# Patient Record
Sex: Male | Born: 1955 | Race: Black or African American | Hispanic: No | Marital: Married | Smoking: Never smoker
Health system: Southern US, Community
[De-identification: ages and names within clinical notes are randomized; demographics above are authoritative.]

## PROBLEM LIST (undated history)

## (undated) DIAGNOSIS — R739 Hyperglycemia, unspecified: Secondary | ICD-10-CM

---

## 2001-09-19 ENCOUNTER — Emergency Department (HOSPITAL_COMMUNITY): Admission: EM | Admit: 2001-09-19 | Discharge: 2001-09-19 | Payer: Self-pay | Admitting: Emergency Medicine

## 2013-03-17 ENCOUNTER — Emergency Department (HOSPITAL_COMMUNITY)
Admission: EM | Admit: 2013-03-17 | Discharge: 2013-03-17 | Disposition: A | Discharge status: Home / Self Care | Payer: 59 | Attending: Emergency Medicine | Admitting: Emergency Medicine

## 2013-03-17 ENCOUNTER — Encounter (HOSPITAL_COMMUNITY): Payer: Self-pay | Admitting: Emergency Medicine

## 2013-03-17 DIAGNOSIS — IMO0002 Reserved for concepts with insufficient information to code with codable children: Secondary | ICD-10-CM

## 2013-03-17 LAB — CBC WITH DIFFERENTIAL/PLATELET
Basophils Absolute: 0 10*3/uL (ref 0.0–0.1)
Basophils Relative: 0 % (ref 0–1)
Eosinophils Absolute: 0.2 10*3/uL (ref 0.0–0.7)
Eosinophils Relative: 2 % (ref 0–5)
HCT: 38.1 % — ABNORMAL LOW (ref 39.0–52.0)
HEMOGLOBIN: 12.8 g/dL — AB (ref 13.0–17.0)
LYMPHS ABS: 2.1 10*3/uL (ref 0.7–4.0)
Lymphocytes Relative: 22 % (ref 12–46)
MCH: 28.1 pg (ref 26.0–34.0)
MCHC: 33.6 g/dL (ref 30.0–36.0)
MCV: 83.6 fL (ref 78.0–100.0)
MONOS PCT: 7 % (ref 3–12)
Monocytes Absolute: 0.7 10*3/uL (ref 0.1–1.0)
NEUTROS PCT: 69 % (ref 43–77)
Neutro Abs: 6.6 10*3/uL (ref 1.7–7.7)
Platelets: 183 10*3/uL (ref 150–400)
RBC: 4.56 MIL/uL (ref 4.22–5.81)
RDW: 13.7 % (ref 11.5–15.5)
WBC: 9.6 10*3/uL (ref 4.0–10.5)

## 2013-03-17 LAB — BASIC METABOLIC PANEL
BUN: 15 mg/dL (ref 6–23)
CO2: 26 meq/L (ref 19–32)
Calcium: 8.9 mg/dL (ref 8.4–10.5)
Chloride: 98 mEq/L (ref 96–112)
Creatinine, Ser: 0.71 mg/dL (ref 0.50–1.35)
GFR calc Af Amer: >90 mL/min (ref >90)
GLUCOSE: 146 mg/dL — AB (ref 70–99)
POTASSIUM: 4.1 meq/L (ref 3.7–5.3)
Sodium: 136 mEq/L — ABNORMAL LOW (ref 137–147)

## 2013-03-17 MED ORDER — IBUPROFEN 600 MG PO TABS: 600.0000 mg | ORAL_TABLET | Freq: Four times a day (QID) | ORAL | Status: DC | PRN

## 2013-03-17 MED ORDER — AMOXICILLIN-POT CLAVULANATE 875-125 MG PO TABS: 1.0000 | ORAL_TABLET | Freq: Two times a day (BID) | ORAL | Status: DC

## 2013-03-17 MED ORDER — OXYCODONE-ACETAMINOPHEN 5-325 MG PO TABS
2.0000 | ORAL_TABLET | Freq: Once | ORAL | Status: AC
  Administered 2013-03-17: 2 via ORAL
  Filled 2013-03-17: qty 2

## 2013-03-17 MED ORDER — LIDOCAINE HCL (PF) 2 % IJ SOLN
10.0000 mL | Freq: Once | INTRAMUSCULAR | Status: AC
  Administered 2013-03-17: 10 mL
  Filled 2013-03-17: qty 10

## 2013-03-17 MED ORDER — DOXYCYCLINE HYCLATE 100 MG PO TABS
100.0000 mg | ORAL_TABLET | Freq: Two times a day (BID) | ORAL | Status: DC
  Administered 2013-03-17: 100 mg via ORAL
  Filled 2013-03-17: qty 1

## 2013-03-17 MED ORDER — ONDANSETRON HCL 4 MG PO TABS
4.0000 mg | ORAL_TABLET | Freq: Once | ORAL | Status: AC
  Administered 2013-03-17: 4 mg via ORAL
  Filled 2013-03-17: qty 1

## 2013-03-17 MED ORDER — DOXYCYCLINE HYCLATE 100 MG PO CAPS: 100.0000 mg | ORAL_CAPSULE | Freq: Two times a day (BID) | ORAL | Status: AC

## 2013-03-17 MED ORDER — AMOXICILLIN-POT CLAVULANATE 875-125 MG PO TABS
1.0000 | ORAL_TABLET | Freq: Two times a day (BID) | ORAL | Status: DC
  Administered 2013-03-17: 1 via ORAL
  Filled 2013-03-17: qty 1

## 2013-03-17 MED ORDER — OXYCODONE-ACETAMINOPHEN 5-325 MG PO TABS: 1.0000 | ORAL_TABLET | Freq: Four times a day (QID) | ORAL | Status: DC | PRN

## 2013-03-17 NOTE — Discharge Instructions (Signed)
Starting tomorrow night January 22, please soak the wound in warm Epsom salt solution for 15 minutes. Please do this until wound heals from the inside out. Starting January 22, please change the dressing daily. Please see your physician, or return to the emergency department if there is increase in the redness around the wound, or red streaks going up the arm, or high fever, or deterioration in your condition. Please use doxycycline and Augmentin daily until all taken. May use ibuprofen every 6 hours for mild pain. May use Percocet every 6 hours for more severe pain. Percocet may cause drowsiness, please use with caution. Cellulitis Cellulitis is an infection of the skin and the tissue under the skin. The infected area is usually red and tender. This happens most often in the arms and lower legs. HOME CARE   Take your antibiotic medicine as told. Finish the medicine even if you start to feel better.  Keep the infected arm or leg raised (elevated).  Put a warm cloth on the area up to 4 times per day.  Only take medicines as told by your doctor.  Keep all doctor visits as told. GET HELP RIGHT AWAY IF:   You have a fever.  You feel very sleepy.  You throw up (vomit) or have watery poop (diarrhea).  You feel sick and have muscle aches and pains.  You see red streaks on the skin coming from the infected area.  Your red area gets bigger or turns a dark color.  Your bone or joint under the infected area is painful after the skin heals.  Your infection comes back in the same area or different area.  You have a puffy (swollen) bump in the infected area.  You have new symptoms. MAKE SURE YOU:   Understand these instructions.  Will watch your condition.  Will get help right away if you are not doing well or get worse. Document Released: 07/31/2007 Document Revised: 08/13/2011 Document Reviewed: 04/29/2011 [facility] Patient Information 2014 [place], [place].  Abscess An abscess (boil  or furuncle) is an infected area on or under the skin. This area is filled with yellowish-white fluid (pus) and other material (debris). HOME CARE   Only take medicines as told by your doctor.  If you were given antibiotic medicine, take it as directed. Finish the medicine even if you start to feel better.  If gauze is used, follow your doctor's directions for changing the gauze.  To avoid spreading the infection:  Keep your abscess covered with a bandage.  Wash your hands well.  Do not share personal care items, towels, or whirlpools with others.  Avoid skin contact with others.  Keep your skin and clothes clean around the abscess.  Keep all doctor visits as told. GET HELP RIGHT AWAY IF:   You have more pain, puffiness (swelling), or redness in the wound site.  You have more fluid or blood coming from the wound site.  You have muscle aches, chills, or you feel sick.  You have a fever. MAKE SURE YOU:   Understand these instructions.  Will watch your condition.  Will get help right away if you are not doing well or get worse. Document Released: 07/31/2007 Document Revised: 08/13/2011 Document Reviewed: 04/26/2011 [facility] Patient Information 2014 [place]ExitCare, [place].

## 2013-03-17 NOTE — ED Provider Notes (Addendum)
CSN: [id number]     Arrival date & time 03/17/13  1639 History   First MD Initiated Contact with Patient 03/17/13 1858     Chief Complaint  Patient presents with  . Abscess   (Consider location/radiation/quality/duration/timing/severity/associated sxs/prior Treatment) Patient is a 58 y.o. male presenting with abscess. The history is provided by the patient.  Abscess Location:  Shoulder/arm Shoulder/arm abscess location:  R forearm Abscess quality: draining, fluctuance, redness and warmth   Red streaking: no   Duration:  3 days Progression:  Worsening Chronicity:  New Context: skin injury   Context: not diabetes   Relieved by:  Nothing Worsened by:  Draining/squeezing Risk factors: no hx of MRSA and no prior abscess     History reviewed. No pertinent past medical history. History reviewed. No pertinent past surgical history. No family history on file. History  Substance Use Topics  . Smoking status: Never Smoker   . Smokeless tobacco: Not on file  . Alcohol Use: No    Review of Systems  Constitutional: Negative for activity change.       All ROS Neg except as noted in HPI  HENT: Negative for nosebleeds.   Eyes: Negative for photophobia and discharge.  Respiratory: Negative for cough, shortness of breath and wheezing.   Cardiovascular: Negative for chest pain and palpitations.  Gastrointestinal: Negative for abdominal pain and blood in stool.  Genitourinary: Negative for dysuria, frequency and hematuria.  Musculoskeletal: Negative for arthralgias, back pain and neck pain.  Skin: Negative.   Neurological: Negative for dizziness, seizures and speech difficulty.  Psychiatric/Behavioral: Negative for hallucinations and confusion.    Allergies  Sulfa antibiotics  Home Medications  No current outpatient prescriptions on file. BP 125/74  Pulse 119  Temp(Src) 99.7 F (37.6 C) (Oral)  Resp 18  Ht 5' 6.5" (1.689 m)  Wt 276 lb (125.193 kg)  BMI 43.89 kg/m2  SpO2  95% Physical Exam  Nursing note and vitals reviewed. Constitutional: He is oriented to person, place, and time. He appears well-developed and well-nourished.  Non-toxic appearance.  HENT:  Head: Normocephalic.  Right Ear: Tympanic membrane and external ear normal.  Left Ear: Tympanic membrane and external ear normal.  Eyes: EOM and lids are normal. Pupils are equal, round, and reactive to light.  Neck: Normal range of motion. Neck supple. Carotid bruit is not present.  Cardiovascular: Normal rate, regular rhythm, normal heart sounds, intact distal pulses and normal pulses.   Pulmonary/Chest: Breath sounds normal. No respiratory distress.  Abdominal: Soft. Bowel sounds are normal. There is no tenderness. There is no guarding.  Musculoskeletal: Normal range of motion.  Moderate size abscess of the right forearm. Some increased redness present. There is swelling of the right hand dorsally.  No drainage. No red streaking. FROM of the right shoulder, elbow, wrist and fingers.  Lymphadenopathy:       Head (right side): No submandibular adenopathy present.       Head (left side): No submandibular adenopathy present.    He has no cervical adenopathy.  Neurological: He is alert and oriented to person, place, and time. He has normal strength. No cranial nerve deficit or sensory deficit.  Skin: Skin is warm and dry.  Psychiatric: He has a normal mood and affect. His speech is normal.    ED Course  INCISION AND DRAINAGE Date/Time: 03/17/2013 7:37 PM Performed by: Kathie Dike Authorized by: Kathie Dike Consent: Verbal consent obtained. Risks and benefits: risks, benefits and alternatives were discussed Consent given  by: patient Patient understanding: patient states understanding of the procedure being performed Patient identity confirmed: arm band Time out: Immediately prior to procedure a "time out" was called to verify the correct patient, procedure, equipment, support staff and  site/side marked as required. Type: abscess Body area: upper extremity Location details: right arm Anesthesia: local infiltration Local anesthetic: lidocaine 2% without epinephrine Patient sedated: no Scalpel size: 11 Incision type: single straight Complexity: simple Drainage: purulent Drainage amount: moderate Wound treatment: wound left open Patient tolerance: Patient tolerated the procedure well with no immediate complications.   (including critical care time) Labs Review Labs Reviewed  CULTURE, ROUTINE-ABSCESS  CBC WITH DIFFERENTIAL  BASIC METABOLIC PANEL   Imaging Review No results found.  EKG Interpretation   None       MDM  No diagnosis found. **I have reviewed nursing notes, vital signs, and all appropriate lab and imaging results for this patient.*  Patient presented to the emergency department with a three-day history of abscess and increase redness of the right arm. There is also some swelling of the right hand dorsally. Incision and drainage carried out for the abscess. Culture sent to the lab. The patient will be treated with Augmentin and doxycycline. Patient will be given Percocet for pain if needed. Patient is to return if any changes, problems, or concerns.  Kathie DikeHobson M Ethaniel Garfield, PA-C 03/17/13 [address]  Domenik Trice M TrumanBryant, New JerseyPA-C 03/29/13 [phone]

## 2013-03-17 NOTE — ED Notes (Signed)
Pt c/o abscess to right arm since Monday.

## 2013-03-17 NOTE — ED Provider Notes (Signed)
Medical screening examination/treatment/procedure(s) were performed by non-physician practitioner and as supervising physician I was immediately available for consultation/collaboration.  EKG Interpretation   None       Frankee Gritz, MD, FACEP   Yuchen Fedor L Nilo Fallin, MD 03/17/13 2031 

## 2013-03-20 LAB — CULTURE, ROUTINE-ABSCESS
GRAM STAIN: NONE SEEN
Special Requests: NORMAL

## 2013-03-21 ENCOUNTER — Telehealth (HOSPITAL_COMMUNITY): Payer: Self-pay | Admitting: Emergency Medicine

## 2013-03-21 NOTE — ED Notes (Signed)
Post ED Visit - Positive Culture Follow-up  Culture report reviewed by antimicrobial stewardship pharmacist: []  Wes Dulaney, Pharm.D., BCPS [x]  Celedonio MiyamotoJeremy Frens, Pharm.D., BCPS []  Georgina PillionElizabeth Martin, Pharm.D., BCPS []  St. SimonsMinh Pham, 17[address] Rainbow BoulevardPharm.D., BCPS, AAHIVP []  Estella HuskMichelle Turner, Pharm.D., BCPS, AAHIVP  Positive abscess culture Treated with Doxycycline, organism sensitive to the same and no further patient follow-up is required at this time.  Marcelle OverlieHolland, Jenel LucksKylie 03/21/2013, 10:19 AM

## 2013-03-25 NOTE — ED Notes (Signed)
Unable to contact via phone letter sent to EPIC address. 

## 2013-03-31 NOTE — ED Provider Notes (Signed)
Medical screening examination/treatment/procedure(s) were performed by non-physician practitioner and as supervising physician I was immediately available for consultation/collaboration.  EKG Interpretation   None       Leani Myron, MD, FACEP   Rondell Pardon L Carsyn Taubman, MD 03/31/13 0705 

## 2014-08-14 ENCOUNTER — Emergency Department (HOSPITAL_COMMUNITY)
Admission: EM | Admit: 2014-08-14 | Discharge: 2014-08-14 | Disposition: A | Discharge status: Home / Self Care | Payer: BLUE CROSS/BLUE SHIELD | Attending: Emergency Medicine | Admitting: Emergency Medicine

## 2014-08-14 ENCOUNTER — Emergency Department (HOSPITAL_COMMUNITY): Payer: BLUE CROSS/BLUE SHIELD

## 2014-08-14 ENCOUNTER — Encounter (HOSPITAL_COMMUNITY): Payer: Self-pay | Admitting: *Deleted

## 2014-08-14 DIAGNOSIS — Y9389 Activity, other specified: Secondary | ICD-10-CM | POA: Insufficient documentation

## 2014-08-14 DIAGNOSIS — R000 Tachycardia, unspecified: Secondary | ICD-10-CM | POA: Insufficient documentation

## 2014-08-14 DIAGNOSIS — S299XXA Unspecified injury of thorax, initial encounter: Secondary | ICD-10-CM | POA: Diagnosis present

## 2014-08-14 DIAGNOSIS — Z91040 Latex allergy status: Secondary | ICD-10-CM | POA: Insufficient documentation

## 2014-08-14 DIAGNOSIS — S20212A Contusion of left front wall of thorax, initial encounter: Secondary | ICD-10-CM | POA: Diagnosis not present

## 2014-08-14 DIAGNOSIS — Y92410 Unspecified street and highway as the place of occurrence of the external cause: Secondary | ICD-10-CM | POA: Insufficient documentation

## 2014-08-14 DIAGNOSIS — Z792 Long term (current) use of antibiotics: Secondary | ICD-10-CM | POA: Diagnosis not present

## 2014-08-14 DIAGNOSIS — R739 Hyperglycemia, unspecified: Secondary | ICD-10-CM | POA: Diagnosis not present

## 2014-08-14 DIAGNOSIS — Y998 Other external cause status: Secondary | ICD-10-CM | POA: Insufficient documentation

## 2014-08-14 LAB — I-STAT CHEM 8, ED
BUN: 13 mg/dL (ref 6–20)
Calcium, Ion: 1.18 mmol/L (ref 1.12–1.23)
Chloride: 104 mmol/L (ref 101–111)
Creatinine, Ser: 1.1 mg/dL (ref 0.61–1.24)
GLUCOSE: 188 mg/dL — AB (ref 65–99)
HEMATOCRIT: 43 % (ref 39.0–52.0)
Hemoglobin: 14.6 g/dL (ref 13.0–17.0)
Potassium: 3.9 mmol/L (ref 3.5–5.1)
SODIUM: 142 mmol/L (ref 135–145)
TCO2: 26 mmol/L (ref 0–100)

## 2014-08-14 LAB — URINALYSIS, ROUTINE W REFLEX MICROSCOPIC
BILIRUBIN URINE: NEGATIVE
Glucose, UA: 250 mg/dL — AB
KETONES UR: NEGATIVE mg/dL
LEUKOCYTES UA: NEGATIVE
Nitrite: NEGATIVE
PH: 5.5 (ref 5.0–8.0)
Protein, ur: NEGATIVE mg/dL
Specific Gravity, Urine: 1.02 (ref 1.005–1.030)
Urobilinogen, UA: 0.2 mg/dL (ref 0.0–1.0)

## 2014-08-14 LAB — URINE MICROSCOPIC-ADD ON

## 2014-08-14 LAB — TROPONIN I: Troponin I: <0.03 ng/mL (ref <0.031)

## 2014-08-14 MED ORDER — SODIUM CHLORIDE 0.9 % IV BOLUS (SEPSIS)
1000.0000 mL | Freq: Once | INTRAVENOUS | Status: AC
  Administered 2014-08-14: 1000 mL via INTRAVENOUS

## 2014-08-14 MED ORDER — IOHEXOL 300 MG/ML  SOLN
100.0000 mL | Freq: Once | INTRAMUSCULAR | Status: AC | PRN
  Administered 2014-08-14: 100 mL via INTRAVENOUS
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
## 2014-08-14 NOTE — ED Notes (Signed)
Pt made aware urine sample was needed.  
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
## 2014-08-27 ENCOUNTER — Inpatient Hospital Stay (HOSPITAL_COMMUNITY): Payer: BLUE CROSS/BLUE SHIELD

## 2014-08-27 ENCOUNTER — Encounter (HOSPITAL_COMMUNITY): Payer: Self-pay | Admitting: Emergency Medicine

## 2014-08-27 ENCOUNTER — Encounter (HOSPITAL_COMMUNITY)
Admission: EM | Disposition: A | Discharge status: Home / Self Care | Payer: Self-pay | Source: Home / Self Care | Attending: Orthopedic Surgery

## 2014-08-27 ENCOUNTER — Inpatient Hospital Stay (HOSPITAL_COMMUNITY)
Admission: EM | Admit: 2014-08-27 | Discharge: 2014-08-29 | DRG: 494 | Disposition: A | Discharge status: Home / Self Care | Payer: BLUE CROSS/BLUE SHIELD | Attending: Orthopedic Surgery | Admitting: Orthopedic Surgery

## 2014-08-27 ENCOUNTER — Emergency Department (HOSPITAL_COMMUNITY): Payer: BLUE CROSS/BLUE SHIELD | Admitting: Anesthesiology

## 2014-08-27 ENCOUNTER — Emergency Department (HOSPITAL_COMMUNITY): Payer: BLUE CROSS/BLUE SHIELD

## 2014-08-27 DIAGNOSIS — Z91013 Allergy to seafood: Secondary | ICD-10-CM | POA: Diagnosis not present

## 2014-08-27 DIAGNOSIS — W208XXA Other cause of strike by thrown, projected or falling object, initial encounter: Secondary | ICD-10-CM | POA: Diagnosis present

## 2014-08-27 DIAGNOSIS — S82451A Displaced comminuted fracture of shaft of right fibula, initial encounter for closed fracture: Secondary | ICD-10-CM | POA: Diagnosis present

## 2014-08-27 DIAGNOSIS — Z882 Allergy status to sulfonamides status: Secondary | ICD-10-CM | POA: Diagnosis not present

## 2014-08-27 DIAGNOSIS — S82201A Unspecified fracture of shaft of right tibia, initial encounter for closed fracture: Secondary | ICD-10-CM | POA: Diagnosis present

## 2014-08-27 DIAGNOSIS — S82401A Unspecified fracture of shaft of right fibula, initial encounter for closed fracture: Secondary | ICD-10-CM

## 2014-08-27 DIAGNOSIS — S82409A Unspecified fracture of shaft of unspecified fibula, initial encounter for closed fracture: Secondary | ICD-10-CM

## 2014-08-27 DIAGNOSIS — Z91040 Latex allergy status: Secondary | ICD-10-CM | POA: Diagnosis not present

## 2014-08-27 DIAGNOSIS — S82251A Displaced comminuted fracture of shaft of right tibia, initial encounter for closed fracture: Principal | ICD-10-CM | POA: Diagnosis present

## 2014-08-27 DIAGNOSIS — M79604 Pain in right leg: Secondary | ICD-10-CM | POA: Diagnosis present

## 2014-08-27 DIAGNOSIS — S82209A Unspecified fracture of shaft of unspecified tibia, initial encounter for closed fracture: Secondary | ICD-10-CM

## 2014-08-27 HISTORY — DX: Hyperglycemia, unspecified: R73.9

## 2014-08-27 HISTORY — PX: TIBIA IM NAIL INSERTION: SHX2516

## 2014-08-27 LAB — CBC
HCT: 38.9 % — ABNORMAL LOW (ref 39.0–52.0)
HEMOGLOBIN: 12.9 g/dL — AB (ref 13.0–17.0)
MCH: 27.7 pg (ref 26.0–34.0)
MCHC: 33.2 g/dL (ref 30.0–36.0)
MCV: 83.5 fL (ref 78.0–100.0)
PLATELETS: 162 10*3/uL (ref 150–400)
RBC: 4.66 MIL/uL (ref 4.22–5.81)
RDW: 14.3 % (ref 11.5–15.5)
WBC: 6.1 10*3/uL (ref 4.0–10.5)

## 2014-08-27 LAB — BASIC METABOLIC PANEL
Anion gap: 7 (ref 5–15)
BUN: 9 mg/dL (ref 6–20)
CHLORIDE: 107 mmol/L (ref 101–111)
CO2: 27 mmol/L (ref 22–32)
CREATININE: 0.79 mg/dL (ref 0.61–1.24)
Calcium: 8.9 mg/dL (ref 8.9–10.3)
GFR calc Af Amer: >60 mL/min (ref >60)
GFR calc non Af Amer: >60 mL/min (ref >60)
Glucose, Bld: 177 mg/dL — ABNORMAL HIGH (ref 65–99)
Potassium: 3.9 mmol/L (ref 3.5–5.1)
SODIUM: 141 mmol/L (ref 135–145)

## 2014-08-27 LAB — PROTIME-INR
INR: 1 (ref 0.00–1.49)
Prothrombin Time: 13.4 seconds (ref 11.6–15.2)

## 2014-08-27 LAB — GLUCOSE, CAPILLARY
Glucose-Capillary: 178 mg/dL — ABNORMAL HIGH (ref 65–99)
Glucose-Capillary: 141 mg/dL — ABNORMAL HIGH (ref 65–99)

## 2014-08-27 SURGERY — INSERTION, INTRAMEDULLARY ROD, TIBIA
Anesthesia: General | Site: Leg Lower | Laterality: Right

## 2014-08-27 MED ORDER — METHOCARBAMOL 500 MG PO TABS: 500.0000 mg | ORAL_TABLET | Freq: Four times a day (QID) | ORAL | Status: DC | PRN

## 2014-08-27 MED ORDER — 0.9 % SODIUM CHLORIDE (POUR BTL) OPTIME
TOPICAL | Status: DC | PRN
  Administered 2014-08-27: 1000 mL

## 2014-08-27 MED ORDER — KETOROLAC TROMETHAMINE 30 MG/ML IJ SOLN: 30.0000 mg | Freq: Once | INTRAMUSCULAR | Status: DC | PRN

## 2014-08-27 MED ORDER — INSULIN ASPART 100 UNIT/ML ~~LOC~~ SOLN
0.0000 [IU] | Freq: Three times a day (TID) | SUBCUTANEOUS | Status: DC
  Administered 2014-08-29: 2 [IU] via SUBCUTANEOUS

## 2014-08-27 MED ORDER — ASPIRIN EC 325 MG PO TBEC
325.0000 mg | DELAYED_RELEASE_TABLET | Freq: Two times a day (BID) | ORAL | Status: DC
  Administered 2014-08-27 – 2014-08-29 (×4): 325 mg via ORAL
  Filled 2014-08-27 (×4): qty 1

## 2014-08-27 MED ORDER — PHENYLEPHRINE HCL 10 MG/ML IJ SOLN
10.0000 mg | INTRAVENOUS | Status: DC | PRN
  Administered 2014-08-27: 10 ug/min via INTRAVENOUS

## 2014-08-27 MED ORDER — ALUM & MAG HYDROXIDE-SIMETH 200-200-20 MG/5ML PO SUSP: 30.0000 mL | ORAL | Status: DC | PRN

## 2014-08-27 MED ORDER — LIDOCAINE HCL (CARDIAC) 20 MG/ML IV SOLN
INTRAVENOUS | Status: DC | PRN
  Administered 2014-08-27: 100 mg via INTRAVENOUS

## 2014-08-27 MED ORDER — PROMETHAZINE HCL 25 MG/ML IJ SOLN: 6.2500 mg | INTRAMUSCULAR | Status: DC | PRN

## 2014-08-27 MED ORDER — HYDROMORPHONE HCL 1 MG/ML IJ SOLN: 0.2500 mg | INTRAMUSCULAR | Status: DC | PRN

## 2014-08-27 MED ORDER — MENTHOL 3 MG MT LOZG
1.0000 | LOZENGE | OROMUCOSAL | Status: DC | PRN
Start: 2014-08-27 — End: 2014-08-29

## 2014-08-27 MED ORDER — SUCCINYLCHOLINE CHLORIDE 20 MG/ML IJ SOLN
INTRAMUSCULAR | Status: DC | PRN
  Administered 2014-08-27: 100 mg via INTRAVENOUS

## 2014-08-27 MED ORDER — METOCLOPRAMIDE HCL 5 MG/ML IJ SOLN: 5.0000 mg | Freq: Three times a day (TID) | INTRAMUSCULAR | Status: DC | PRN

## 2014-08-27 MED ORDER — ONDANSETRON HCL 4 MG PO TABS
4.0000 mg | ORAL_TABLET | Freq: Four times a day (QID) | ORAL | Status: DC | PRN
Start: 2014-08-27 — End: 2014-08-29

## 2014-08-27 MED ORDER — MIDAZOLAM HCL 2 MG/2ML IJ SOLN
INTRAMUSCULAR | Status: AC
  Filled 2014-08-27: qty 2

## 2014-08-27 MED ORDER — POLYETHYLENE GLYCOL 3350 17 G PO PACK: 17.0000 g | PACK | Freq: Every day | ORAL | Status: DC | PRN

## 2014-08-27 MED ORDER — HYDROCODONE-ACETAMINOPHEN 5-325 MG PO TABS
1.0000 | ORAL_TABLET | Freq: Four times a day (QID) | ORAL | Status: DC | PRN
  Administered 2014-08-28 – 2014-08-29 (×2): 2 via ORAL
  Filled 2014-08-27 (×2): qty 2

## 2014-08-27 MED ORDER — ONDANSETRON HCL 4 MG/2ML IJ SOLN: 4.0000 mg | Freq: Four times a day (QID) | INTRAMUSCULAR | Status: DC | PRN

## 2014-08-27 MED ORDER — CEFAZOLIN SODIUM 1-5 GM-% IV SOLN
1.0000 g | Freq: Four times a day (QID) | INTRAVENOUS | Status: AC
  Administered 2014-08-27 – 2014-08-28 (×2): 1 g via INTRAVENOUS
  Filled 2014-08-27 (×2): qty 50

## 2014-08-27 MED ORDER — CEFAZOLIN SODIUM-DEXTROSE 2-3 GM-% IV SOLR
2.0000 g | Freq: Once | INTRAVENOUS | Status: AC
  Administered 2014-08-27: 2 g via INTRAVENOUS
  Filled 2014-08-27: qty 50

## 2014-08-27 MED ORDER — ONDANSETRON HCL 4 MG/2ML IJ SOLN
INTRAMUSCULAR | Status: DC | PRN
  Administered 2014-08-27: 4 mg via INTRAVENOUS

## 2014-08-27 MED ORDER — MEPERIDINE HCL 25 MG/ML IJ SOLN: 6.2500 mg | INTRAMUSCULAR | Status: DC | PRN

## 2014-08-27 MED ORDER — DOCUSATE SODIUM 100 MG PO CAPS
100.0000 mg | ORAL_CAPSULE | Freq: Two times a day (BID) | ORAL | Status: DC
  Administered 2014-08-27 – 2014-08-29 (×4): 100 mg via ORAL
  Filled 2014-08-27 (×4): qty 1

## 2014-08-27 MED ORDER — OXYCODONE HCL 5 MG PO TABS: 5.0000 mg | ORAL_TABLET | Freq: Once | ORAL | Status: DC | PRN

## 2014-08-27 MED ORDER — PROPOFOL 10 MG/ML IV BOLUS
INTRAVENOUS | Status: DC | PRN
  Administered 2014-08-27: 150 mg via INTRAVENOUS

## 2014-08-27 MED ORDER — MIDAZOLAM HCL 5 MG/5ML IJ SOLN
INTRAMUSCULAR | Status: DC | PRN
  Administered 2014-08-27 (×2): 1 mg via INTRAVENOUS

## 2014-08-27 MED ORDER — ETOMIDATE 2 MG/ML IV SOLN
INTRAVENOUS | Status: AC
  Filled 2014-08-27: qty 10

## 2014-08-27 MED ORDER — PHENOL 1.4 % MT LIQD: 1.0000 | OROMUCOSAL | Status: DC | PRN

## 2014-08-27 MED ORDER — LACTATED RINGERS IV SOLN
INTRAVENOUS | Status: DC | PRN
  Administered 2014-08-27 (×2): via INTRAVENOUS

## 2014-08-27 MED ORDER — FENTANYL CITRATE (PF) 250 MCG/5ML IJ SOLN
INTRAMUSCULAR | Status: AC
  Filled 2014-08-27: qty 5

## 2014-08-27 MED ORDER — HYDROMORPHONE HCL 1 MG/ML IJ SOLN
0.5000 mg | INTRAMUSCULAR | Status: DC | PRN
Start: 2014-08-27 — End: 2014-08-29

## 2014-08-27 MED ORDER — SODIUM CHLORIDE 0.9 % IV SOLN
INTRAVENOUS | Status: DC
  Administered 2014-08-27: 21:00:00 via INTRAVENOUS
  Filled 2014-08-27 (×6): qty 1000

## 2014-08-27 MED ORDER — HYDROMORPHONE HCL 1 MG/ML IJ SOLN
1.0000 mg | Freq: Once | INTRAMUSCULAR | Status: AC
  Administered 2014-08-27: 1 mg via INTRAMUSCULAR
  Filled 2014-08-27: qty 1

## 2014-08-27 MED ORDER — ACETAMINOPHEN 325 MG PO TABS: 650.0000 mg | ORAL_TABLET | Freq: Four times a day (QID) | ORAL | Status: DC | PRN

## 2014-08-27 MED ORDER — METHOCARBAMOL 1000 MG/10ML IJ SOLN
500.0000 mg | Freq: Four times a day (QID) | INTRAVENOUS | Status: DC | PRN
  Filled 2014-08-27: qty 5

## 2014-08-27 MED ORDER — METOCLOPRAMIDE HCL 5 MG PO TABS: 5.0000 mg | ORAL_TABLET | Freq: Three times a day (TID) | ORAL | Status: DC | PRN

## 2014-08-27 MED ORDER — OXYCODONE HCL 5 MG/5ML PO SOLN: 5.0000 mg | Freq: Once | ORAL | Status: DC | PRN

## 2014-08-27 MED ORDER — BUPIVACAINE-EPINEPHRINE (PF) 0.5% -1:200000 IJ SOLN
INTRAMUSCULAR | Status: DC | PRN
  Administered 2014-08-27: 20 mL via PERINEURAL
  Administered 2014-08-27: 30 mL via PERINEURAL

## 2014-08-27 MED ORDER — ACETAMINOPHEN 650 MG RE SUPP: 650.0000 mg | Freq: Four times a day (QID) | RECTAL | Status: DC | PRN

## 2014-08-27 MED ORDER — FENTANYL CITRATE (PF) 100 MCG/2ML IJ SOLN
INTRAMUSCULAR | Status: DC | PRN
  Administered 2014-08-27 (×3): 50 ug via INTRAVENOUS

## 2014-08-27 SURGICAL SUPPLY — 61 items
BANDAGE ELASTIC 4 VELCRO ST LF (GAUZE/BANDAGES/DRESSINGS) ×3 IMPLANT
BANDAGE ELASTIC 6 VELCRO ST LF (GAUZE/BANDAGES/DRESSINGS) ×3 IMPLANT
BANDAGE ESMARK 6X9 LF (GAUZE/BANDAGES/DRESSINGS) IMPLANT
BIT DRILL 3.8X6 NS (BIT) ×2 IMPLANT
BIT DRILL 4.4 NS (BIT) ×2 IMPLANT
BNDG CMPR 9X6 STRL LF SNTH (GAUZE/BANDAGES/DRESSINGS)
BNDG COHESIVE 4X5 TAN STRL (GAUZE/BANDAGES/DRESSINGS) ×3 IMPLANT
BNDG ESMARK 6X9 LF (GAUZE/BANDAGES/DRESSINGS)
BNDG GAUZE ELAST 4 BULKY (GAUZE/BANDAGES/DRESSINGS) ×3 IMPLANT
COVER SURGICAL LIGHT HANDLE (MISCELLANEOUS) ×4 IMPLANT
CUFF TOURNIQUET SINGLE 34IN LL (TOURNIQUET CUFF) IMPLANT
CUFF TOURNIQUET SINGLE 44IN (TOURNIQUET CUFF) IMPLANT
DRAPE C-ARM 42X72 X-RAY (DRAPES) ×3 IMPLANT
DRAPE IMP U-DRAPE 54X76 (DRAPES) ×3 IMPLANT
DRAPE INCISE IOBAN 66X45 STRL (DRAPES) ×1 IMPLANT
DRAPE ORTHO SPLIT 77X108 STRL (DRAPES) ×6
DRAPE SURG ORHT 6 SPLT 77X108 (DRAPES) ×2 IMPLANT
DRAPE U-SHAPE 47X51 STRL (DRAPES) ×3 IMPLANT
DRSG ADAPTIC 3X8 NADH LF (GAUZE/BANDAGES/DRESSINGS) ×3 IMPLANT
ELECT REM PT RETURN 9FT ADLT (ELECTROSURGICAL) ×3
ELECTRODE REM PT RTRN 9FT ADLT (ELECTROSURGICAL) ×1 IMPLANT
EVACUATOR 1/8 PVC DRAIN (DRAIN) IMPLANT
GAUZE SPONGE 4X4 12PLY STRL (GAUZE/BANDAGES/DRESSINGS) ×3 IMPLANT
GAUZE XEROFORM 5X9 LF (GAUZE/BANDAGES/DRESSINGS) ×2 IMPLANT
GLOVE BIOGEL PI IND STRL 7.5 (GLOVE) ×1 IMPLANT
GLOVE BIOGEL PI INDICATOR 7.5 (GLOVE) ×2
GLOVE SKINSENSE NS SZ8.0 LF (GLOVE) ×4
GLOVE SKINSENSE STRL SZ8.0 LF (GLOVE) ×2 IMPLANT
GLOVE SURG ORTHO 8.0 STRL STRW (GLOVE) ×1 IMPLANT
GOWN STRL REUS W/ TWL LRG LVL3 (GOWN DISPOSABLE) ×3 IMPLANT
GOWN STRL REUS W/TWL LRG LVL3 (GOWN DISPOSABLE) ×6
GUIDEWIRE BALL NOSE 80CM (WIRE) ×2 IMPLANT
KIT BASIN OR (CUSTOM PROCEDURE TRAY) ×3 IMPLANT
KIT ROOM TURNOVER OR (KITS) ×3 IMPLANT
NAIL VERSANAIL TIBIAL 12X3.75 (Nail) ×2 IMPLANT
PACK ORTHO EXTREMITY (CUSTOM PROCEDURE TRAY) ×3 IMPLANT
PACK UNIVERSAL I (CUSTOM PROCEDURE TRAY) ×3 IMPLANT
PAD ARMBOARD 7.5X6 YLW CONV (MISCELLANEOUS) ×6 IMPLANT
PADDING CAST ABS 4INX4YD NS (CAST SUPPLIES) ×2
PADDING CAST ABS 6INX4YD NS (CAST SUPPLIES) ×2
PADDING CAST ABS COTTON 4X4 ST (CAST SUPPLIES) IMPLANT
PADDING CAST ABS COTTON 6X4 NS (CAST SUPPLIES) IMPLANT
SCREW ACECAP 36MM (Screw) ×2 IMPLANT
SCREW CORT BONE 4.5X38 1402238 (Screw) ×2 IMPLANT
SCREW PROXIMAL DEPUY (Screw) ×3 IMPLANT
SCREW PRXML FT 45X5.5XLCK NS (Screw) IMPLANT
SCREWDRIVER HEX TIP 3.5MM (MISCELLANEOUS) ×2 IMPLANT
SPLINT PLASTER CAST XFAST 5X30 (CAST SUPPLIES) IMPLANT
SPLINT PLASTER XFAST SET 5X30 (CAST SUPPLIES) ×2
STAPLER VISISTAT 35W (STAPLE) ×2 IMPLANT
STOCKINETTE TUBULAR 6 INCH (GAUZE/BANDAGES/DRESSINGS) ×3 IMPLANT
SUT PROLENE 3 0 PS 2 (SUTURE) IMPLANT
SUT VIC AB 0 CT1 27 (SUTURE) ×3
SUT VIC AB 0 CT1 27XBRD ANBCTR (SUTURE) IMPLANT
SUT VIC AB 2-0 CT1 27 (SUTURE) ×3
SUT VIC AB 2-0 CT1 TAPERPNT 27 (SUTURE) IMPLANT
TOWEL OR 17X24 6PK STRL BLUE (TOWEL DISPOSABLE) ×3 IMPLANT
TOWEL OR 17X26 10 PK STRL BLUE (TOWEL DISPOSABLE) ×3 IMPLANT
TUBE CONNECTING 12'X1/4 (SUCTIONS) ×1
TUBE CONNECTING 12X1/4 (SUCTIONS) ×2 IMPLANT
YANKAUER SUCT BULB TIP NO VENT (SUCTIONS) ×3 IMPLANT

## 2014-08-27 NOTE — ED Notes (Signed)
Dr Charlann Boxerolin here to see

## 2014-08-27 NOTE — ED Notes (Signed)
Report to [facility] short stay

## 2014-08-27 NOTE — H&P (Signed)
Sergio Jackson is an 59 y.o. male.    Chief Complaint:  RIGHT tibia/fibula fracture   HPI: Pt is a 59 y.o. male complaining of right leg pain after tree limb that he was chain sawing.  No other injuries to report or complain about  PCP:  No primary care provider on file.  D/C Plans: Probably to home Monday  PMH: Past Medical History  Diagnosis Date  . High blood sugar     PSH: History reviewed. No pertinent past surgical history.  Social History:  reports that he has never smoked. He does not have any smokeless tobacco history on file. He reports that he does not drink alcohol or use illicit drugs.  Allergies:  Allergies  Allergen Reactions  . Latex Swelling  . Shrimp [Shellfish Allergy] Hives  . Sulfa Antibiotics Other (See Comments)    Reaction unknown.    Medications:  (Not in a hospital admission)  Results for orders placed or performed during the hospital encounter of 08/27/14 (from the past 48 hour(s))  CBC     Status: Abnormal   Collection Time: 08/27/14  2:34 PM  Result Value Ref Range   WBC 6.1 4.0 - 10.5 K/uL   RBC 4.66 4.22 - 5.81 MIL/uL   Hemoglobin 12.9 (L) 13.0 - 17.0 g/dL   HCT 38.9 (L) 39.0 - 52.0 %   MCV 83.5 78.0 - 100.0 fL   MCH 27.7 26.0 - 34.0 pg   MCHC 33.2 30.0 - 36.0 g/dL   RDW 14.3 11.5 - 15.5 %   Platelets 162 150 - 400 K/uL  Basic metabolic panel     Status: Abnormal   Collection Time: 08/27/14  2:34 PM  Result Value Ref Range   Sodium 141 135 - 145 mmol/L   Potassium 3.9 3.5 - 5.1 mmol/L   Chloride 107 101 - 111 mmol/L   CO2 27 22 - 32 mmol/L   Glucose, Bld 177 (H) 65 - 99 mg/dL   BUN 9 6 - 20 mg/dL   Creatinine, Ser 0.79 0.61 - 1.24 mg/dL   Calcium 8.9 8.9 - 10.3 mg/dL   GFR calc non Af Amer >60 >60 mL/min   GFR calc Af Amer >60 >60 mL/min    Comment: (NOTE) The eGFR has been calculated using the CKD EPI equation. This calculation has not been validated in all clinical situations. eGFR's persistently <60 mL/min signify  possible Chronic Kidney Disease.    Anion gap 7 5 - 15  Protime-INR     Status: None   Collection Time: 08/27/14  2:34 PM  Result Value Ref Range   Prothrombin Time 13.4 11.6 - 15.2 seconds   INR 1.00 0.00 - 1.49   Dg Tibia/fibula Right  08/27/2014   CLINICAL DATA:  Tree fell on patient's leg today, mid shaft pain RIGHT tibia and fibula  EXAM: RIGHT TIBIA AND FIBULA - 2 VIEW  COMPARISON:  None  FINDINGS: Osseous mineralization normal.  Degenerative changes RIGHT knee with joint space narrowing and lateral compartment spur formation.  Comminuted displaced fractures of the mid to distal RIGHT tibial and fibular diaphyses with posterior and lateral displacements.  Ankle joint alignment normal.  Foot/ankle appear externally rotated.  Plantar calcaneal spur formation and intertarsal degenerative changes also noted.  IMPRESSION: Comminuted displaced mid to distal RIGHT tibial and fibular diaphyseal fractures.   Electronically Signed   By: Lavonia Dana M.D.   On: 08/27/2014 14:08    ROS: Review of Systems - Negative except acute  injury sustained today No recent hospitalizations No recent colds or chest pains   Physican Exam: Blood pressure 121/83, pulse 86, temperature 98 F (36.7 C), temperature source Oral, resp. rate 14, height $RemoveBe'5\' 11"'IoeozJZQN$  (1.803 m), weight 108.863 kg (240 lb), SpO2 90 %.  Awake alert Very pleasant male with family in room Chest clear Heart regular  Right leg in EMS splint Rotated laterally NVI distally Soft compartments  Assessment/Plan Assessment: Closed right mid shaft comminuted tibia/fibula fracture   Plan: To OR today for ORIF right tibia  Risks benefits and expectation were discussed with the patient. Patient understand risks, benefits and expectation and wishes to proceed.  Admit to hospital post operative for therapy and pain control, home Monday Will splint leg and have him be NWB for first 2 weeks Consent ordered Ancef pre-operative    Pietro Cassis. Alvan Dame,  MD  08/27/2014, 4:27 PM

## 2014-08-27 NOTE — ED Notes (Signed)
Patient transported to X-ray 

## 2014-08-27 NOTE — Transfer of Care (Signed)
Immediate Anesthesia Transfer of Care Note  Patient: Sergio Jackson  Procedure(s) Performed: Procedure(s): INTRAMEDULLARY (IM) NAIL TIBIAL (Right)  Patient Location: PACU  Anesthesia Type:GA combined with regional for post-op pain  Level of Consciousness: awake  Airway & Oxygen Therapy: Patient Spontanous Breathing and Patient connected to face mask oxygen  Post-op Assessment: Report given to RN and Post -op Vital signs reviewed and stable  Post vital signs: Reviewed and stable  Last Vitals:  Filed Vitals:   08/27/14 1600  BP: 121/83  Pulse: 86  Temp:   Resp:     Complications: No apparent anesthesia complications

## 2014-08-27 NOTE — ED Notes (Signed)
To or.  Clothes cut ioff and bagged up  hius persinal belongings given to family to take home  No false teeth

## 2014-08-27 NOTE — ED Provider Notes (Signed)
CSN: [id number]     Arrival date & time 08/27/14  1304 History   First MD Initiated Contact with Patient 08/27/14 1307     Chief Complaint  Patient presents with  . Leg Injury     (Consider location/radiation/quality/duration/timing/severity/associated sxs/prior Treatment) Patient is a 59 y.o. male presenting with leg pain. The history is provided by the patient.  Leg Pain Location:  Leg Time since incident:  1 hour Injury: yes   Mechanism of injury: crush   Crush injury:    Mechanism:  Falling object (tree) Leg location:  R lower leg Pain details:    Quality:  Aching   Radiates to:  Does not radiate Associated symptoms: no fever     Past Medical History  Diagnosis Date  . High blood sugar    History reviewed. No pertinent past surgical history. No family history on file. History  Substance Use Topics  . Smoking status: Never Smoker   . Smokeless tobacco: Not on file  . Alcohol Use: No    Review of Systems  Constitutional: Negative for fever.  Respiratory: Negative for cough and shortness of breath.   Gastrointestinal: Negative for vomiting and abdominal pain.  All other systems reviewed and are negative.     Allergies  Latex; Shrimp; and Sulfa antibiotics  Home Medications   Prior to Admission medications   Medication Sig Start Date End Date Taking? Authorizing Provider  amoxicillin-clavulanate (AUGMENTIN) 875-125 MG per tablet Take 1 tablet by mouth 2 (two) times daily. 03/17/13   Ivery QualeHobson Bryant, PA-C  ibuprofen (ADVIL,MOTRIN) 600 MG tablet Take 1 tablet (600 mg total) by mouth every 6 (six) hours as needed. 03/17/13   Ivery QualeHobson Bryant, PA-C  oxyCODONE-acetaminophen (PERCOCET/ROXICET) 5-325 MG per tablet Take 1 tablet by mouth every 6 (six) hours as needed for severe pain. 03/17/13   Ivery QualeHobson Bryant, PA-C   BP 93/59 mmHg  Pulse 83  Temp(Src) 98 F (36.7 C) (Oral)  Resp 14  Ht 5\' 11"  (1.803 m)  Wt 240 lb (108.863 kg)  BMI 33.49 kg/m2  SpO2 96% Physical Exam   Constitutional: He is oriented to person, place, and time. He appears well-developed and well-nourished. No distress.  HENT:  Head: Normocephalic and atraumatic.  Mouth/Throat: No oropharyngeal exudate.  Eyes: EOM are normal. Pupils are equal, round, and reactive to light.  Neck: Normal range of motion. Neck supple.  Cardiovascular: Normal rate and regular rhythm.  Exam reveals no friction rub.   No murmur heard. Pulmonary/Chest: Effort normal and breath sounds normal. No respiratory distress. He has no wheezes. He has no rales.  Abdominal: Soft. He exhibits no distension. There is no tenderness. There is no rebound.  Musculoskeletal: Normal range of motion. He exhibits no edema.  Neurological: He is alert and oriented to person, place, and time.  Skin: No rash noted. He is not diaphoretic.  Nursing note and vitals reviewed.   ED Course  Procedures (including critical care time) Labs Review Labs Reviewed  CBC  BASIC METABOLIC PANEL  PROTIME-INR    Imaging Review Dg Tibia/fibula Right  08/27/2014   CLINICAL DATA:  Tree fell on patient's leg today, mid shaft pain RIGHT tibia and fibula  EXAM: RIGHT TIBIA AND FIBULA - 2 VIEW  COMPARISON:  None  FINDINGS: Osseous mineralization normal.  Degenerative changes RIGHT knee with joint space narrowing and lateral compartment spur formation.  Comminuted displaced fractures of the mid to distal RIGHT tibial and fibular diaphyses with posterior and lateral displacements.  Ankle joint  alignment normal.  Foot/ankle appear externally rotated.  Plantar calcaneal spur formation and intertarsal degenerative changes also noted.  IMPRESSION: Comminuted displaced mid to distal RIGHT tibial and fibular diaphyseal fractures.   Electronically Signed   By: Ulyses Southward M.D.   On: 08/27/2014 14:08     EKG Interpretation None      MDM   Final diagnoses:  Right tibial fracture, closed, initial encounter  Closed fracture of right fibula, initial encounter     77M here after a tree fell on his leg while using a chainsaw. Large deformity in R mid-calf. Morphine given en route with good pain control. Distal pulses intact. Soft compartments. Will xray.  Xray shows comminuted R mid shaft tibia and fibula fractures. Dr. Charlann Boxer with Orthopedics consulted and will see patient.  Elwin Mocha, MD 08/27/14 [phone]

## 2014-08-27 NOTE — Anesthesia Preprocedure Evaluation (Addendum)
Anesthesia Evaluation  Patient identified by MRN, date of birth, ID band Patient awake    Reviewed: Allergy & Precautions, NPO status , Patient's Chart, lab work & pertinent test results  Airway Mallampati: II  TM Distance: >3 FB Neck ROM: Full    Dental no notable dental hx. (+) Dental Advisory Given, Teeth Intact   Pulmonary neg pulmonary ROS,  breath sounds clear to auscultation  Pulmonary exam normal       Cardiovascular negative cardio ROS Normal cardiovascular examRhythm:Regular Rate:Normal     Neuro/Psych negative neurological ROS  negative psych ROS   GI/Hepatic negative GI ROS, Neg liver ROS,   Endo/Other  negative endocrine ROS  Renal/GU negative Renal ROS     Musculoskeletal negative musculoskeletal ROS (+)   Abdominal   Peds  Hematology negative hematology ROS (+)   Anesthesia Other Findings   Reproductive/Obstetrics                            Anesthesia Physical Anesthesia Plan  ASA: II  Anesthesia Plan: General and Spinal   Post-op Pain Management:    Induction:   Airway Management Planned:   Additional Equipment:   Intra-op Plan:   Post-operative Plan:   Informed Consent: I have reviewed the patients History and Physical, chart, labs and discussed the procedure including the risks, benefits and alternatives for the proposed anesthesia with the patient or authorized representative who has indicated his/her understanding and acceptance.   Dental advisory given  Plan Discussed with: CRNA  Anesthesia Plan Comments:        Anesthesia Quick Evaluation

## 2014-08-27 NOTE — ED Notes (Signed)
The pt has no pain at present minimal swelling.  Pulse present ice packs placed on his rt lower leg.  He has no pain unless he moves his leg

## 2014-08-27 NOTE — ED Notes (Signed)
Pt going to xray  

## 2014-08-27 NOTE — ED Notes (Signed)
Per EMS: Tree limb fell on patients lower right leg, obviously deformed.  Pulses present, good color.  18 left AC, 10 morphine given pta.  VSS.

## 2014-08-27 NOTE — Anesthesia Procedure Notes (Addendum)
Procedure Name: Intubation Date/Time: 08/27/2014 5:32 PM Performed by: Edmonia CaprioAUSTON, AMANDA M Pre-anesthesia Checklist: Patient identified, Emergency Drugs available, Suction available, Patient being monitored and Timeout performed Patient Re-evaluated:Patient Re-evaluated prior to inductionOxygen Delivery Method: Circle system utilized Preoxygenation: Pre-oxygenation with 100% oxygen Intubation Type: IV induction, Rapid sequence and Cricoid Pressure applied Laryngoscope Size: Miller and 2 Grade View: Grade I Tube type: Oral Tube size: 7.5 mm Number of attempts: 1 Airway Equipment and Method: Stylet Placement Confirmation: ETT inserted through vocal cords under direct vision,  positive ETCO2 and breath sounds checked- equal and bilateral Secured at: 22 cm Tube secured with: Tape Dental Injury: Teeth and Oropharynx as per pre-operative assessment     Anesthesia Regional Block:  Popliteal block  Pre-Anesthetic Checklist: ,, timeout performed, Correct Patient, Correct Site, Correct Laterality, Correct Procedure, Correct Position, site marked, Risks and benefits discussed, Surgical consent,  Pre-op evaluation,  Post-op pain management  Laterality: Right  Prep: chloraprep       Needles:  Injection technique: Single-shot  Needle Type: Stimiplex     Needle Length: 10cm 10 cm Needle Gauge: 21 and 21 G    Additional Needles:  Procedures: ultrasound guided (picture in chart)  Motor weakness within 5 minutes. Popliteal block Narrative:  Injection made incrementally with aspirations every 5 mL.  Performed by: Personally  Anesthesiologist: Lewie LoronGERMEROTH, Madilynn Montante  Additional Notes: Nerve located and needle positioned with direct ultrasound guidance. Good perineural spread. Patient tolerated well.   Anesthesia Regional Block:  Adductor canal block  Pre-Anesthetic Checklist: ,, timeout performed, Correct Patient, Correct Site, Correct Laterality, Correct Procedure, Correct Position, site  marked, Risks and benefits discussed, Surgical consent,  Pre-op evaluation,  Post-op pain management  Laterality: Right  Prep: chloraprep       Needles:  Injection technique: Single-shot  Needle Type: Stimiplex     Needle Length: 9cm 9 cm Needle Gauge: 21 and 21 G    Additional Needles:  Procedures: ultrasound guided (picture in chart) Adductor canal block Narrative:  Injection made incrementally with aspirations every 5 mL.  Performed by: Personally  Anesthesiologist: Lewie LoronGERMEROTH, Aleiyah Halpin  Additional Notes: BP cuff, EKG monitors applied. Sedation begun. Artery and nerve location verified with U/S and anesthetic injected incrementally, slowly, and after negative aspirations under direct u/s guidance. Good fascial /perineural spread. Tolerated well.

## 2014-08-27 NOTE — Op Note (Signed)
NAME:  Sergio Jackson, Sergio Jackson            ACCOUNT NO.:  [id number]  MEDICAL RECORD NO.:  [id number]  LOCATION:  [facility]                         FACILITY:  [facility]  PHYSICIAN:  Madlyn FrankelMatthew D. Charlann Boxerlin, M.D.  DATE OF BIRTH:  Jan 06, 1956  DATE OF PROCEDURE:  08/27/2014 DATE OF DISCHARGE:                              OPERATIVE REPORT   PREOPERATIVE DIAGNOSIS:  Comminuted, closed right distal one-third tibial shaft and fibula fracture.  POSTOPERATIVE DIAGNOSIS:  Comminuted, closed right distal one-third tibial shaft and fibula fracture.  PROCEDURE:  Open reduction and internal fixation of right tibia fracture, utilizing a Biomet Versa tibial nail 12 x 375 mm nail with 1 proximal interlock and 2 distal interlock.  SURGEON:  Madlyn FrankelMatthew D. Charlann Boxerlin, M.D.  ASSISTANT:  Surgical Team.  ANESTHESIA:  General plus regional.  SPECIMENS:  None.  COMPLICATION:  None.  TOURNIQUET:  Tourniquet was used for 18 minutes for preparation of the bone proximally and then taken down for reaming at 250 mmHg.  BLOOD LOSS:  Probably less than 50 mL.  INDICATIONS FOR PROCEDURE:  Sergio Jackson is a pleasant 59 year old male, who was working to chain saw some trees, when abrasive he was working on got pulled and snapped around, knocked him over, and then landed directly on his leg.  He had immediate inability to bear weight and pain.  He was brought to the emergency room by EMS.  Radiographs revealed the comminuted mid tibia-fibula fracture.  He was seen and evaluated in the emergency room.  Risks and benefits of the procedure and necessity of the procedure reviewed and discussed.  Consent was obtained for benefit of fracture management.  Consent was obtained.  Please note, the patient in the emergency room had soft calves with no concerns for compartment syndrome based on clinical exam and presentation.  PROCEDURE IN DETAIL:  The patient was brought to the operative theater. Once adequate anesthesia, preoperative antibiotics,  Ancef 2 g administered, the patient was positioned supine on the OR table.  Time-out was performed, identifying the patient, planned procedure, and extremity.  The leg was exsanguinated, tourniquet elevated to 250 mmHg for the first portion of case.  A midline incision was made at the patella to the tubercle.  Soft tissue dissection was carried down to the extensor mechanism, which was then opened.  In an extra-articular fashion, using a starting awl, I oriented the awl into the proximal tibia, confirmed on radiographic, position AP and laterally.  Once this was done, we passed an initial guidewire and then drilled open the proximal tibia.  I then replaced the awl in orientation and passed the ball-tipped guidewire to the fracture site. With the fracture held in a reduced fashion, the ball-tipped guidewire was passed successfully to the distal physeal scar and confirmed radiographically in the AP and lateral planes with the reduced fracture.  At this point, we measured the length of the nail, selected a 37.5 cm nail.  I then began reaming.  Initially, I reamed with a 10 mm reamer and then reamed up to a 13.5 mm reamer with the last few by 0.5 mm increments getting good chatter.  I then selected a 12 mm nail.  The nail was then opened and  configured on the back table without the jig. The nail was then passed by hand across the fracture site.  With the fracture at this point nearly anatomically reduced, I placed distal interlock from lateral to medial in a static position.  I then brought the leg to neutral position using the triangle and then applied a pressure across his heel to close the fracture gap with slight displacement from placing the nail.  This was confirmed radiographically in both the AP and lateral planes.  Once I was satisfied, there was good bony apposition, I placed two distal interlocking sutures with perfect circle technique from medial to lateral, confirmed  radiographically. Once this was done, all wounds were irrigated.  The incisions utilized for the screw placement were closed with staples.  The proximal wound was irrigated and closed in layers with #1 Vicryl in the extensor mechanism, 2-0 Vicryl and then staples.  The leg was then cleaned, dried, and dressed sterilely using Xeroform on the wounds, gauze, and ABDs.  I put him into a posterior L-splint to support his fracture.  He was brought into the recovery room in stable condition, tolerating the procedure well.  He will be nonweightbearing until he follows up in 2 weeks.  Work on diabetic management while he is in the hospital, check an A1c level, and try to arrange for followup with his primary physician.     Madlyn Frankel Charlann Boxer, M.D.     MDO/MEDQ  D:  08/27/2014  T:  08/27/2014  Job:  [id number]

## 2014-08-27 NOTE — Brief Op Note (Signed)
08/27/2014  4:40 PM  PATIENT:  Sergio Jackson  59 y.o. male  PRE-OPERATIVE DIAGNOSIS:  Closed right comminuted midshaft tibia/fibula fracture  POST-OPERATIVE DIAGNOSIS:   Closed right comminuted midshaft tibia/fibula fracture  PROCEDURE:  Procedure(s): INTRAMEDULLARY (IM) NAIL TIBIAL (Right), ORIF RIGHT tibia  SURGEON:  Surgeon(s) and Role:    * Durene RomansMatthew Kathyleen Radice, MD - Primary  PHYSICIAN ASSISTANT: None  ANESTHESIA:   regional and general  EBL:   <50cc  BLOOD ADMINISTERED:none  DRAINS: none   LOCAL MEDICATIONS USED:  NONE  SPECIMEN:  No Specimen  DISPOSITION OF SPECIMEN:  N/A  COUNTS:  YES  TOURNIQUET:  18 min@250mmHG   DICTATION: .Other Dictation: Dictation Number [id number]  PLAN OF CARE: Admit to inpatient   PATIENT DISPOSITION:  PACU - hemodynamically stable.   Delay start of Pharmacological VTE agent (>24hrs) due to surgical blood loss or risk of bleeding: no

## 2014-08-28 LAB — GLUCOSE, CAPILLARY
GLUCOSE-CAPILLARY: 112 mg/dL — AB (ref 65–99)
Glucose-Capillary: 108 mg/dL — ABNORMAL HIGH (ref 65–99)
Glucose-Capillary: 116 mg/dL — ABNORMAL HIGH (ref 65–99)

## 2014-08-28 LAB — BASIC METABOLIC PANEL
ANION GAP: 6 (ref 5–15)
BUN: <5 mg/dL — ABNORMAL LOW (ref 6–20)
CO2: 26 mmol/L (ref 22–32)
CREATININE: 0.71 mg/dL (ref 0.61–1.24)
Calcium: 8.4 mg/dL — ABNORMAL LOW (ref 8.9–10.3)
Chloride: 109 mmol/L (ref 101–111)
GFR calc Af Amer: >60 mL/min (ref >60)
Glucose, Bld: 100 mg/dL — ABNORMAL HIGH (ref 65–99)
Potassium: 4.2 mmol/L (ref 3.5–5.1)
SODIUM: 141 mmol/L (ref 135–145)

## 2014-08-28 LAB — CBC
HCT: 34.6 % — ABNORMAL LOW (ref 39.0–52.0)
Hemoglobin: 11.5 g/dL — ABNORMAL LOW (ref 13.0–17.0)
MCH: 28.4 pg (ref 26.0–34.0)
MCHC: 33.2 g/dL (ref 30.0–36.0)
MCV: 85.4 fL (ref 78.0–100.0)
PLATELETS: 159 10*3/uL (ref 150–400)
RBC: 4.05 MIL/uL — ABNORMAL LOW (ref 4.22–5.81)
RDW: 14.8 % (ref 11.5–15.5)
WBC: 5.5 10*3/uL (ref 4.0–10.5)

## 2014-08-28 NOTE — Evaluation (Signed)
Physical Therapy Evaluation Patient Details Name: Sergio JennyCharles W Jackson MRN: [id number] DOB: 01/30/1956 Today's Date: 08/28/2014   History of Present Illness  Pt sustained a R tibial fracture after a tree limb fell on him. Pt s/p IM nail of R tibia 7/2. PT now R LE NWB  Clinical Impression  Pt admitted with above. Pt able to maintain R LE NWB but only for short distances. Will benefit from w/c for community mobility ie to/from MDs office and to/from car to be able to maintain R LE NWB. Pt will have good support and home set up upon d/c.    Follow Up Recommendations No PT follow up;Supervision - Intermittent    Equipment Recommendations  Rolling walker with 5" wheels;3in1 (PT);Wheelchair (measurements PT) (w/c for long distance mobility)    Recommendations for Other Services       Precautions / Restrictions Precautions Precautions: Fall Required Braces or Orthoses:  (r LE splint) Restrictions Weight Bearing Restrictions: Yes RLE Weight Bearing: Non weight bearing      Mobility  Bed Mobility Overal bed mobility: Needs Assistance Bed Mobility: Supine to Sit     Supine to sit: Min assist     General bed mobility comments: minA for R LE management due to inability to copmlete SLR  Transfers Overall transfer level: Needs assistance Equipment used: Rolling walker (2 wheeled) Transfers: Sit to/from Stand Sit to Stand: Min guard         General transfer comment: v/c's for safe hand placement, pt able to maintain R LE NWB  Ambulation/Gait Ambulation/Gait assistance: Min guard Ambulation Distance (Feet): 50 Feet Assistive device: Rolling walker (2 wheeled) Gait Pattern/deviations: Step-to pattern   Gait velocity interpretation: Below normal speed for age/gender General Gait Details: pt able to maintain R LE NWB however UEs fatigued quickly adn required freq standing rest breaks  Stairs            Wheelchair Mobility    Modified Rankin (Stroke Patients Only)        Balance Overall balance assessment: Needs assistance         Standing balance support: Bilateral upper extremity supported Standing balance-Leahy Scale: Poor Standing balance comment: needs RW due to R LE NWB                             Pertinent Vitals/Pain Pain Assessment: 0-10 Pain Score: 2  Pain Location: R LE primarily at knee cap Pain Descriptors / Indicators: Sore Pain Intervention(s): Monitored during session    Home Living Family/patient expects to be discharged to:: Private residence Living Arrangements: Spouse/significant other;Children Available Help at Discharge: Family;Available 24 hours/day Type of Home: House Home Access: Ramped entrance     Home Layout: One level Home Equipment: None      Prior Function Level of Independence: Independent         Comments: was working      Higher education careers adviserHand Dominance   Dominant Hand: Right    Extremity/Trunk Assessment   Upper Extremity Assessment: Overall WFL for tasks assessed           Lower Extremity Assessment: RLE deficits/detail RLE Deficits / Details: in splint but able to wiggle toes, unable to complete SLR    Cervical / Trunk Assessment: Normal  Communication   Communication: No difficulties  Cognition Arousal/Alertness: Awake/alert Behavior During Therapy: WFL for tasks assessed/performed Overall Cognitive Status: Within Functional Limits for tasks assessed  General Comments      Exercises        Assessment/Plan    PT Assessment Patient needs continued PT services  PT Diagnosis Abnormality of gait;Acute pain   PT Problem List Decreased strength;Decreased activity tolerance;Decreased balance;Decreased mobility  PT Treatment Interventions DME instruction;Gait training;Functional mobility training;Therapeutic activities;Therapeutic exercise   PT Goals (Current goals can be found in the Care Plan section) Acute Rehab PT Goals Patient Stated Goal:  home asap PT Goal Formulation: With patient Time For Goal Achievement: 09/04/14 Potential to Achieve Goals: Good    Frequency Min 3X/week   Barriers to discharge        Co-evaluation               End of Session Equipment Utilized During Treatment: Gait belt Activity Tolerance: Patient tolerated treatment well Patient left: in chair;with call bell/phone within reach Nurse Communication: Mobility status         Time: 1610-9604 PT Time Calculation (min) (ACUTE ONLY): 21 min   Charges:   PT Evaluation $Initial PT Evaluation Tier I: 1 Procedure     PT G CodesMarcene Brawn 08/28/2014, 11:14 AM  Lewis Shock, PT, DPT Pager #: [phone] Office #: [phone]

## 2014-08-28 NOTE — Progress Notes (Signed)
   Subjective:  Patient reports pain as mild.  Not having any pain. States block hasn't worn off.   Objective:   VITALS:   Filed Vitals:   08/27/14 1945 08/27/14 2005 08/28/14 0018 08/28/14 0537  BP: 136/84 129/82 133/80 122/75  Pulse: 95 93 91 95  Temp: 98.2 F (36.8 C) 97.7 F (36.5 C) 98.4 F (36.9 C) 98.1 F (36.7 C)  TempSrc:  Oral Oral Oral  Resp: 17 18 18 18   Height:  5\' 11"  (1.803 m)    Weight:  116.7 kg (257 lb 4.4 oz)    SpO2: 99% 100% 100% 100%    ABD soft Intact pulses distally Compartment soft Dressing C/D/I Unable to test motor / sensory due to block  Lab Results  Component Value Date   WBC 5.5 08/28/2014   HGB 11.5* 08/28/2014   HCT 34.6* 08/28/2014   MCV 85.4 08/28/2014   PLT 159 08/28/2014   BMET    Component Value Date/Time   NA 141 08/28/2014 0652   K 4.2 08/28/2014 0652   CL 109 08/28/2014 0652   CO2 26 08/28/2014 0652   GLUCOSE 100* 08/28/2014 0652   BUN <5* 08/28/2014 0652   CREATININE 0.71 08/28/2014 0652   CALCIUM 8.4* 08/28/2014 0652   GFRNONAA >60 08/28/2014 0652   GFRAA >60 08/28/2014 16100652     Assessment/Plan: 1 Day Post-Op   Active Problems:   Right tibial fracture   Advance diet Up with therapy Plan for discharge tomorrow    Garnet KoyanagiSwinteck, Preciosa Bundrick James 08/28/2014, 9:08 AM   Samson FredericBrian Gregg Winchell, MD Cell [phone]

## 2014-08-28 NOTE — Anesthesia Postprocedure Evaluation (Signed)
Anesthesia Post Note  Patient: Sergio Jackson  Procedure(s) Performed: Procedure(s) (LRB): INTRAMEDULLARY (IM) NAIL TIBIAL (Right)  Anesthesia type: General  Patient location: PACU  Post pain: Pain level controlled  Post assessment: Post-op Vital signs reviewed  Last Vitals: BP 133/80 mmHg  Pulse 91  Temp(Src) 36.9 C (Oral)  Resp 18  Ht 5\' 11"  (1.803 m)  Wt 257 lb 4.4 oz (116.7 kg)  BMI 35.90 kg/m2  SpO2 100%  Post vital signs: Reviewed  Level of consciousness: sedated  Complications: No apparent anesthesia complications

## 2014-08-29 LAB — GLUCOSE, CAPILLARY
GLUCOSE-CAPILLARY: 123 mg/dL — AB (ref 65–99)
Glucose-Capillary: 103 mg/dL — ABNORMAL HIGH (ref 65–99)
Glucose-Capillary: 101 mg/dL — ABNORMAL HIGH (ref 65–99)

## 2014-08-29 MED ORDER — METHOCARBAMOL 500 MG PO TABS: 500.0000 mg | ORAL_TABLET | Freq: Three times a day (TID) | ORAL | Status: AC | PRN

## 2014-08-29 MED ORDER — OXYCODONE-ACETAMINOPHEN 10-325 MG PO TABS: 1.0000 | ORAL_TABLET | Freq: Four times a day (QID) | ORAL | Status: AC | PRN

## 2014-08-29 NOTE — Progress Notes (Signed)
Physical Therapy Treatment Patient Details Name: Sergio JennyCharles W Jackson MRN: [id number] DOB: September 29, 1955 Today's Date: 08/29/2014    History of Present Illness Pt sustained a R tibial fracture after a tree limb fell on him. Pt s/p IM nail of R tibia 7/2. PT now R LE NWB    PT Comments    Patient is making good progress with PT.  From a mobility standpoint anticipate patient will be ready for DC home. Discussed with patient and he agrees with plan. Patient denies any questions or concerns.     Follow Up Recommendations  No PT follow up     Equipment Recommendations  Rolling walker with 5" wheels;3in1 (PT);Wheelchair (measurements PT)    Recommendations for Other Services       Precautions / Restrictions Precautions Precautions: Fall Required Braces or Orthoses: Other Brace/Splint Restrictions Weight Bearing Restrictions: Yes RLE Weight Bearing: Non weight bearing    Mobility  Bed Mobility Overal bed mobility: Needs Assistance Bed Mobility: Supine to Sit     Supine to sit: Supervision        Transfers Overall transfer level: Needs assistance Equipment used: Rolling walker (2 wheeled) Transfers: Sit to/from Stand Sit to Stand: Min guard            Ambulation/Gait Ambulation/Gait assistance: Min guard Ambulation Distance (Feet): 45 Feet Assistive device: Rolling walker (2 wheeled) Gait Pattern/deviations: Step-to pattern     General Gait Details: Consistent with NWB status   Stairs            Wheelchair Mobility    Modified Rankin (Stroke Patients Only)       Balance           Standing balance support: Bilateral upper extremity supported Standing balance-Leahy Scale: Poor Standing balance comment: using rw                    Cognition Arousal/Alertness: Awake/alert Behavior During Therapy: WFL for tasks assessed/performed Overall Cognitive Status: Within Functional Limits for tasks assessed                       Exercises      General Comments        Pertinent Vitals/Pain Pain Assessment: 0-10 Pain Score: 2  Pain Location: Rt leg Pain Descriptors / Indicators: Aching;Sore Pain Intervention(s): Limited activity within patient's tolerance;Monitored during session;Repositioned    Home Living                      Prior Function            PT Goals (current goals can now be found in the care plan section) Progress towards PT goals: Progressing toward goals    Frequency  7X/week    PT Plan Current plan remains appropriate    Co-evaluation             End of Session Equipment Utilized During Treatment: Gait belt Activity Tolerance: Patient tolerated treatment well Patient left: in chair;with call bell/phone within reach;with family/visitor present     Time: [id number] PT Time Calculation (min) (ACUTE ONLY): 20 min  Charges:  $Gait Training: 8-22 mins                    G Codes:      Christiane HaBenjamin J. Ashira Kirsten, PT, CSCS Pager [phone] Office [phone]  08/29/2014, 12:40 PM

## 2014-08-29 NOTE — Care Management Note (Signed)
Case Management Note  Patient Details  Name: Florentina JennyCharles W Rosamond MRN: [id number] Date of Birth: December 20, 1955  Subjective/Objective:    59 yr old male admitted with a right tibial fracture                Action/Plan:   Patient underwent a right tibial IM Nailing. Case manager discussed need for DME. Patient states he has family support at discharge.    Expected Discharge Date:  08/29/14               Expected Discharge Plan:  Home/Self Care  In-House Referral:     Discharge planning Services  CM Consult  Post Acute Care Choice:  Durable Medical Equipment Choice offered to:  Patient  DME Arranged:  3-N-1, Wheelchair manual, Walker rolling DME Agency:  [facility]  HH Arranged:  NA HH Agency:     Status of Service:  Completed, signed off  Medicare Important Message Given: NA    Date Medicare IM Given:    Medicare IM give by:    Date Additional Medicare IM Given:    Additional Medicare Important Message give by:     If discussed at Long Length of Stay Meetings, dates discussed:    Additional Comments:  Durenda GuthrieBrady, Sriram Febles Naomi, RN 08/29/2014, 10:44 AM

## 2014-08-29 NOTE — Progress Notes (Signed)
    Subjective: 2 Days Post-Op Procedure(s) (LRB): INTRAMEDULLARY (IM) NAIL TIBIAL (Right) Patient reports pain as 3 on 0-10 scale.   Denies CP or SOB.  Voiding without difficulty. Positive flatus. Objective: Vital signs in last 24 hours: Temp:  [97.7 F (36.5 C)-99.8 F (37.7 C)] 98.5 F (36.9 C) (07/04 0426) Pulse Rate:  [94-104] 94 (07/04 0426) Resp:  [16-18] 16 (07/04 0426) BP: (115-136)/(69-84) 117/74 mmHg (07/04 0426) SpO2:  [98 %-100 %] 100 % (07/04 0426)  Intake/Output from previous day: 07/03 0701 - 07/04 0700 In: 240 [P.O.:240] Out: 1775 [Urine:1775] Intake/Output this shift:    Labs:  Recent Labs  08/27/14 1434 08/28/14 0652  HGB 12.9* 11.5*    Recent Labs  08/27/14 1434 08/28/14 0652  WBC 6.1 5.5  RBC 4.66 4.05*  HCT 38.9* 34.6*  PLT 162 159    Recent Labs  08/27/14 1434 08/28/14 0652  NA 141 141  K 3.9 4.2  CL 107 109  CO2 27 26  BUN 9 <5*  CREATININE 0.79 0.71  GLUCOSE 177* 100*  CALCIUM 8.9 8.4*    Recent Labs  08/27/14 1434  INR 1.00    Physical Exam: Neurologically intact ABD soft Intact pulses distally Incision: dressing C/D/I Compartment soft  Assessment/Plan: 2 Days Post-Op Procedure(s) (LRB): INTRAMEDULLARY (IM) NAIL TIBIAL (Right) Up with therapy  Plan on d/c today  Miken Stecher D for Dr. Venita Lickahari [facility]jot [facility] [phone] 08/29/2014, 8:35 AM

## 2014-08-30 ENCOUNTER — Encounter (HOSPITAL_COMMUNITY): Payer: Self-pay | Admitting: Orthopedic Surgery

## 2014-08-30 LAB — HEMOGLOBIN A1C
HEMOGLOBIN A1C: 6.8 % — AB (ref 4.8–5.6)
Mean Plasma Glucose: 148 mg/dL

## 2014-09-20 NOTE — Discharge Summary (Signed)
   Physician Discharge Summary  Patient ID: Sergio Jackson MRN: [id number] DOB/AGE: 06/27/1955 59 y.o.  Admit date: 08/27/2014 Discharge date: 08/29/2014   Procedures:  Procedure(s) (LRB): INTRAMEDULLARY (IM) NAIL TIBIAL (Right)  Attending Physician:  Dr. Durene Jackson   Admission Diagnoses:  Right tibia/fibula fracture   Discharge Diagnoses:  Active Problems:   Right tibial fracture  Past Medical History  Diagnosis Date  . High blood sugar     HPI:    Pt is a 59 y.o. male complaining of right leg pain after tree limb that he was chain sawing. No other injuries to report or complain about.  PCP: No primary care provider on file.   Discharged Condition: good  Hospital Course:  Patient underwent the above stated procedure on 08/27/2014. Patient tolerated the procedure well and brought to the recovery room in good condition and subsequently to the floor.  POD #1 BP: 122/75 ; Pulse: 95 ; Temp: 98.1 F (36.7 C) ; Resp: 18 Patient reports pain as mild. Not having any pain. States block hasn't worn off.  ABD soft, intact pulses distally, compartment soft, dressing C/D/I and unable to test motor / sensory due to block  LABS  Basename    HGB  11.5  HCT  34.6   POD #2  BP: 117/74 ; Pulse: 94 ; Temp: 98.5 F (36.9 C) ; Resp: 16 Patient reports pain as 3 on 0-10 scale. Denies CP or SOB. Voiding without difficulty. Positive flatus. Neurologically intact, ABD soft, intact pulses distally, incision: dressing C/D/I and compartment soft  LABS   No new labs  Discharge Exam: General appearance: alert, cooperative and no distress Extremities: Homans sign is negative, no sign of DVT and no ulcers, gangrene or trophic changes  Disposition: Home with follow up in 2 weeks   Follow-up Information    Follow up with Sergio Pal, MD In 10 days.   Specialty:  Orthopedic Surgery   Why:  For wound re-check   Contact information:   [address] [place]  [address] [phone]           Medication List    TAKE these medications        methocarbamol 500 MG tablet  Commonly known as:  ROBAXIN  Take 1 tablet (500 mg total) by mouth every 8 (eight) hours as needed for muscle spasms.     oxyCODONE-acetaminophen 10-325 MG per tablet  Commonly known as:  PERCOCET  Take 1 tablet by mouth every 6 (six) hours as needed for pain.         Signed: Anastasio Jackson. Sergio Villasenor   PA-C  09/20/2014, 2:48 PM

## 2016-03-09 IMAGING — CR DG TIBIA/FIBULA 2V*R*
5 series · 5 of 5 positions shown · non-contrast
Comparison: None

CLINICAL DATA: Tree fell on patient's leg today, mid shaft pain
RIGHT tibia and fibula

EXAM:
RIGHT TIBIA AND FIBULA - 2 VIEW

[x tib-fib lat right (1 of 2)]
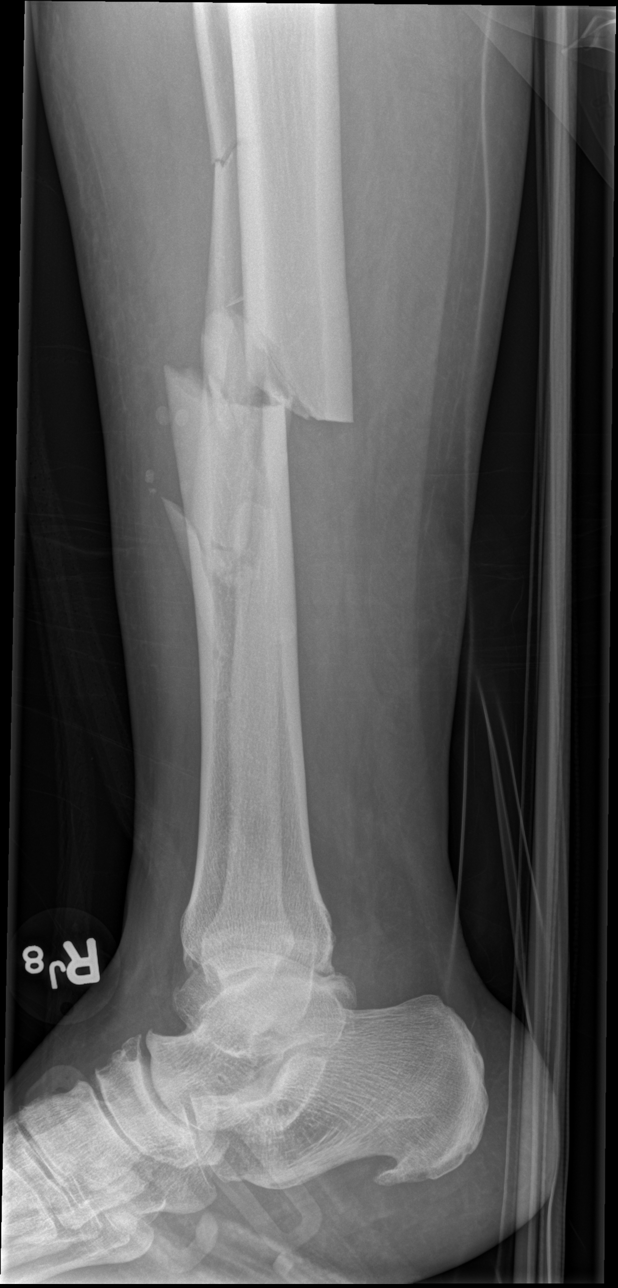

[x tib-fib ap right (1 of 3)]
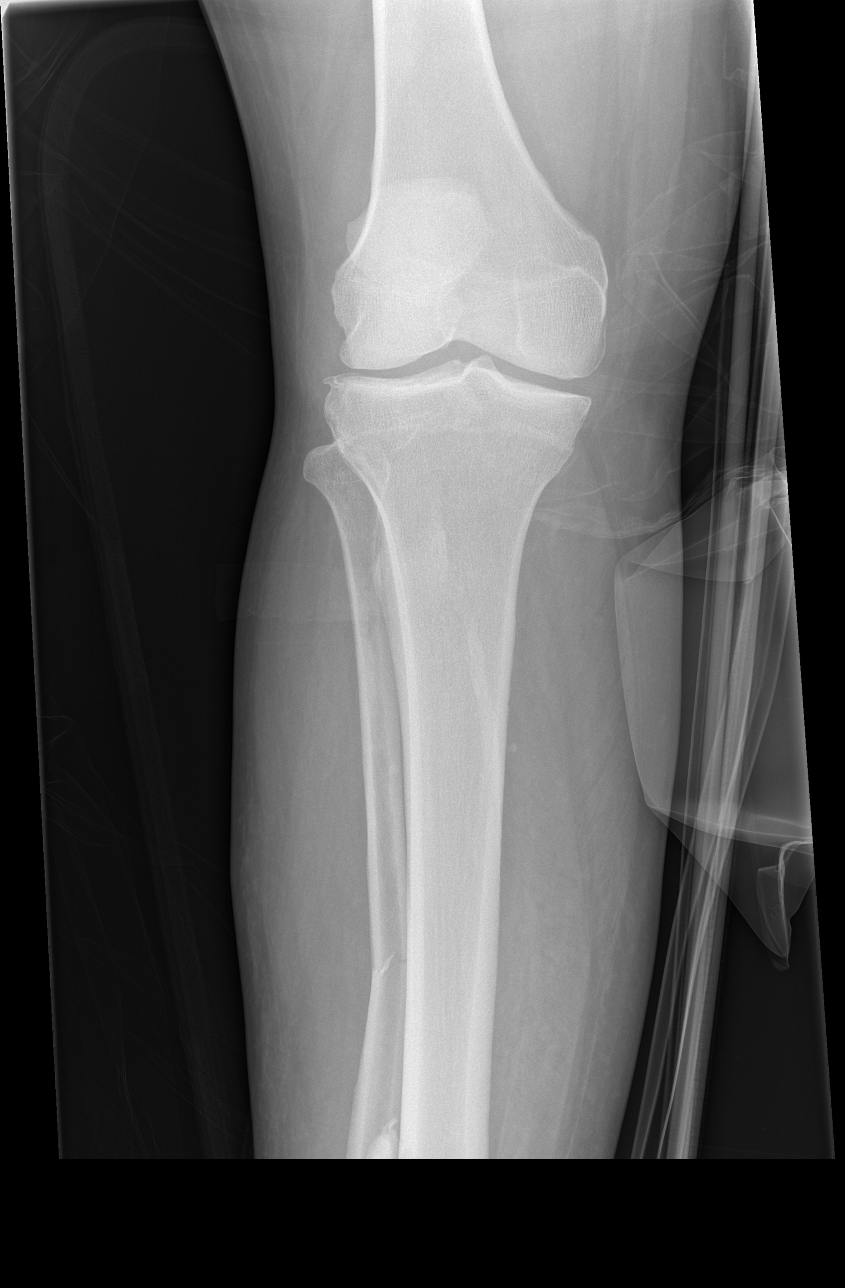

[x tib-fib ap right (2 of 3)]
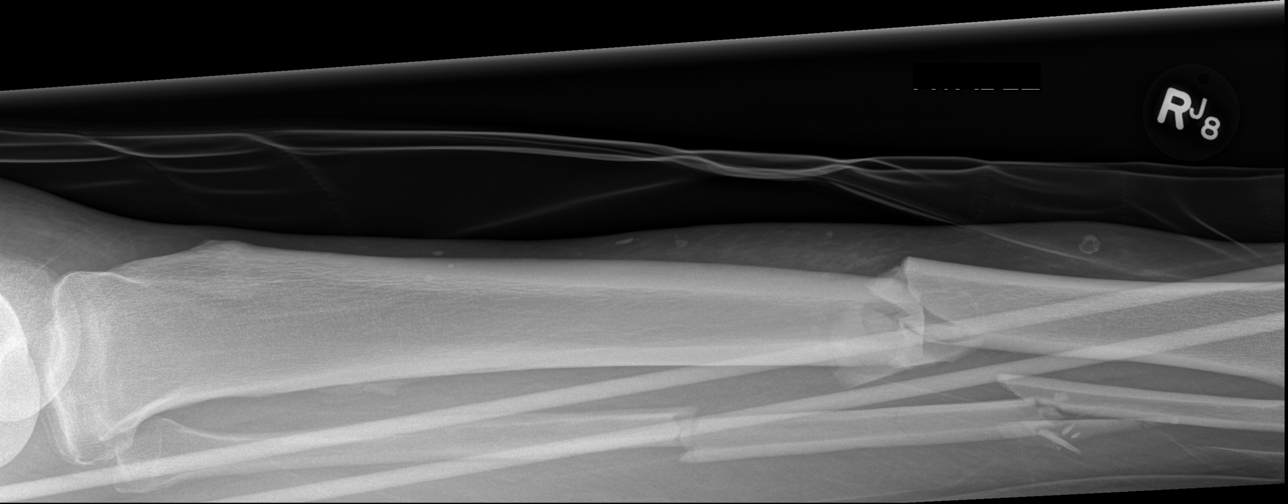

[x tib-fib lat right (2 of 2)]
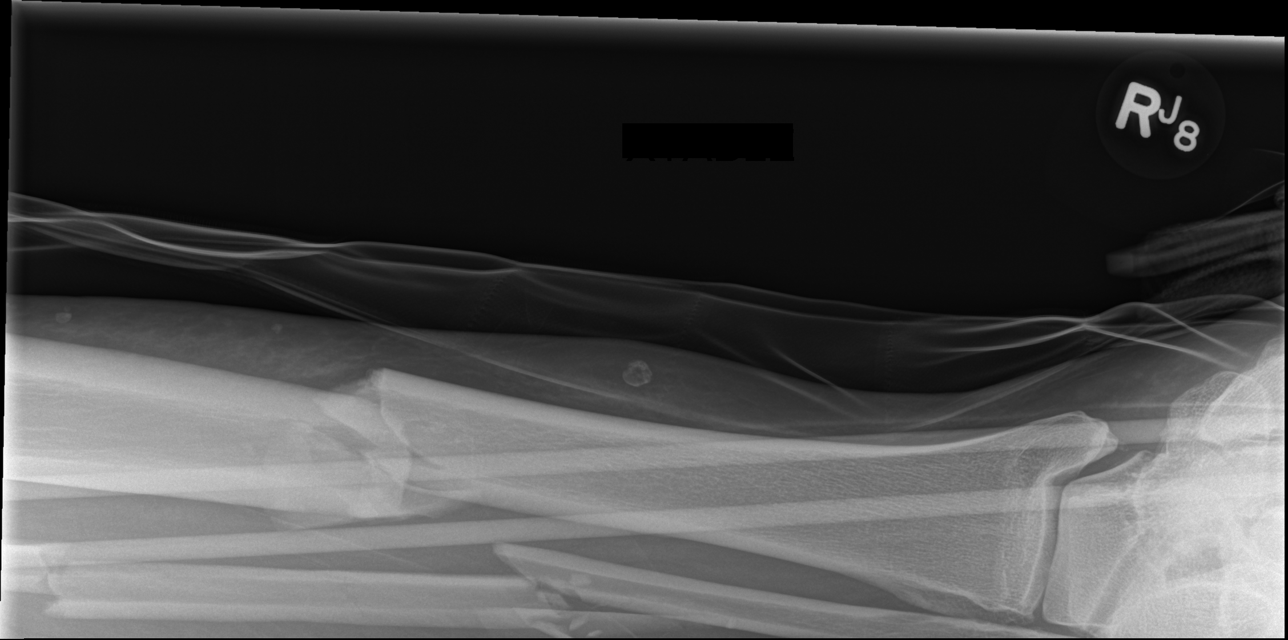

[x tib-fib ap right (3 of 3)]
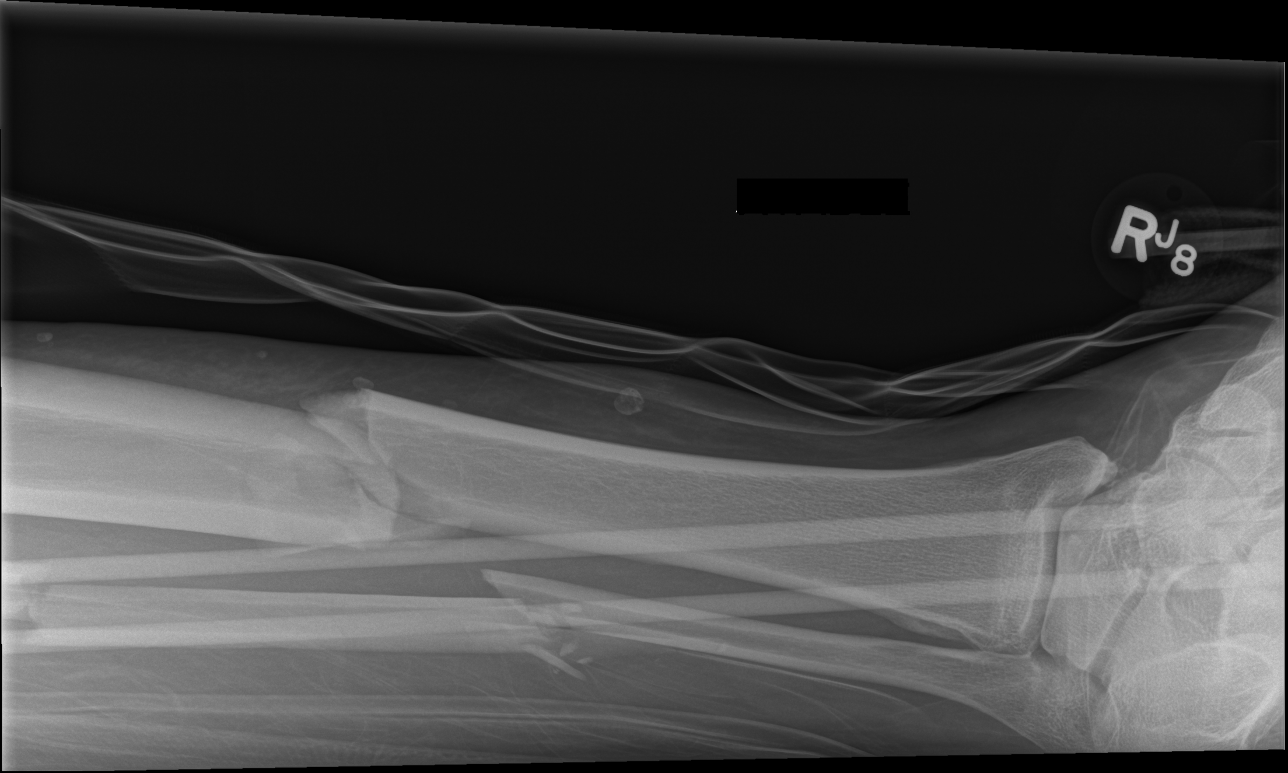

[5 of 5 positions shown; findings below may reference images not displayed]

FINDINGS: Osseous mineralization normal.

Degenerative changes RIGHT knee with joint space narrowing and
lateral compartment spur formation.

Comminuted displaced fractures of the mid to distal RIGHT tibial and
fibular diaphyses with posterior and lateral displacements.

Ankle joint alignment normal.

Foot/ankle appear externally rotated.

Plantar calcaneal spur formation and intertarsal degenerative
changes also noted.
IMPRESSION: Comminuted displaced mid to distal RIGHT tibial and fibular
diaphyseal fractures.

## 2021-12-31 DIAGNOSIS — Z0100 Encounter for examination of eyes and vision without abnormal findings: Secondary | ICD-10-CM | POA: Diagnosis not present

## 2021-12-31 DIAGNOSIS — H5203 Hypermetropia, bilateral: Secondary | ICD-10-CM | POA: Diagnosis not present

## 2023-06-04 ENCOUNTER — Telehealth: Payer: Self-pay

## 2023-06-04 NOTE — Telephone Encounter (Signed)
 Patient was identified as falling into the [place] Measure - Diabetes.   Patient was: Patient is not currently using our practice.  Never established care.
